# Patient Record
Sex: Male | Born: 2000 | Race: Black or African American | Hispanic: No | Marital: Single | Smoking: Former smoker
Health system: Southern US, Community
[De-identification: ages and names within clinical notes are randomized; demographics above are authoritative.]

## PROBLEM LIST (undated history)

## (undated) DIAGNOSIS — J45909 Unspecified asthma, uncomplicated: Secondary | ICD-10-CM

## (undated) DIAGNOSIS — F909 Attention-deficit hyperactivity disorder, unspecified type: Secondary | ICD-10-CM

---
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
## 2020-07-18 IMAGING — CR DG CHEST 2V
2 series · 2 of 2 positions shown · non-contrast
Comparison: None.

CLINICAL DATA: Cough and congestion for 4 days.

EXAM:
CHEST - 2 VIEW

[w chest pa]
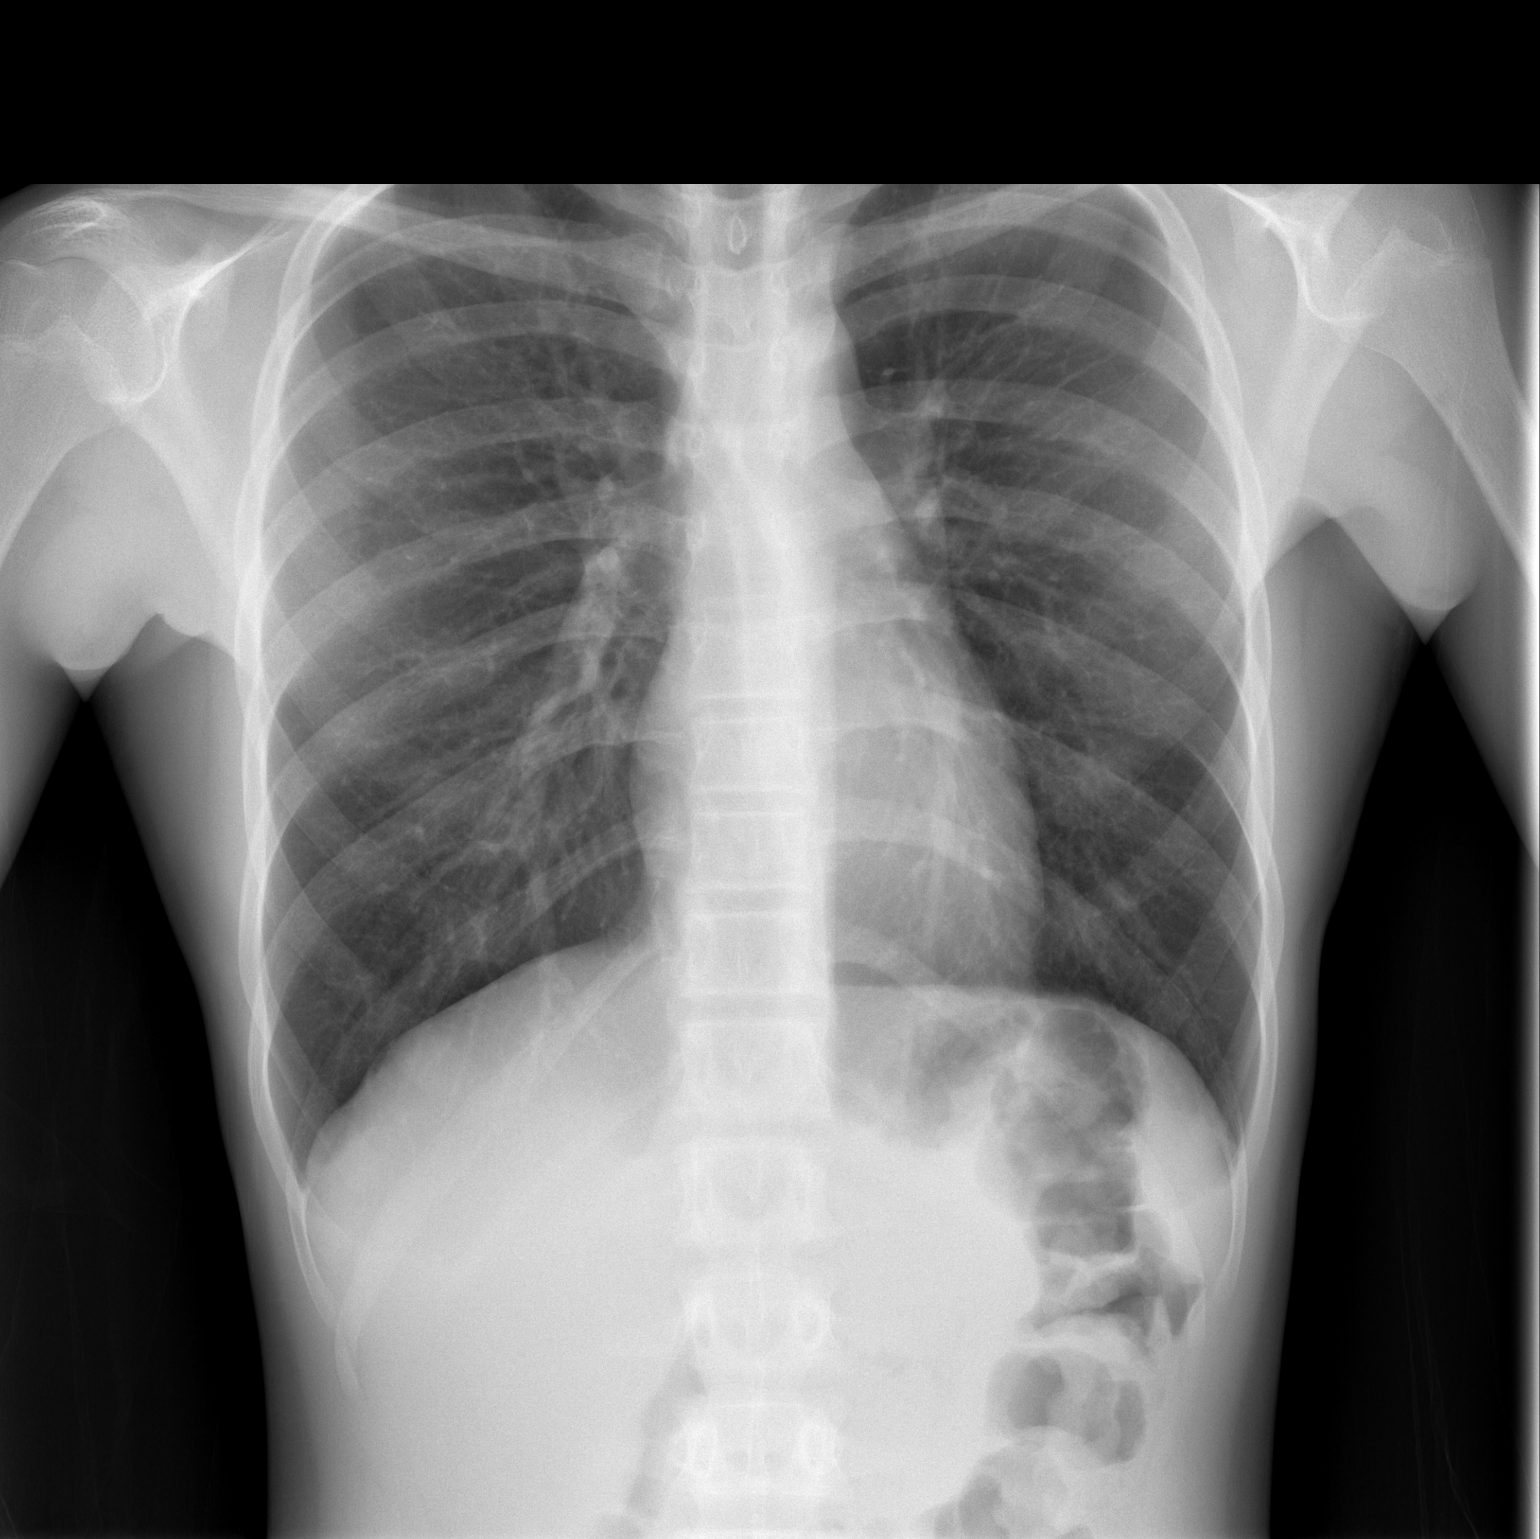

[w chest lat]
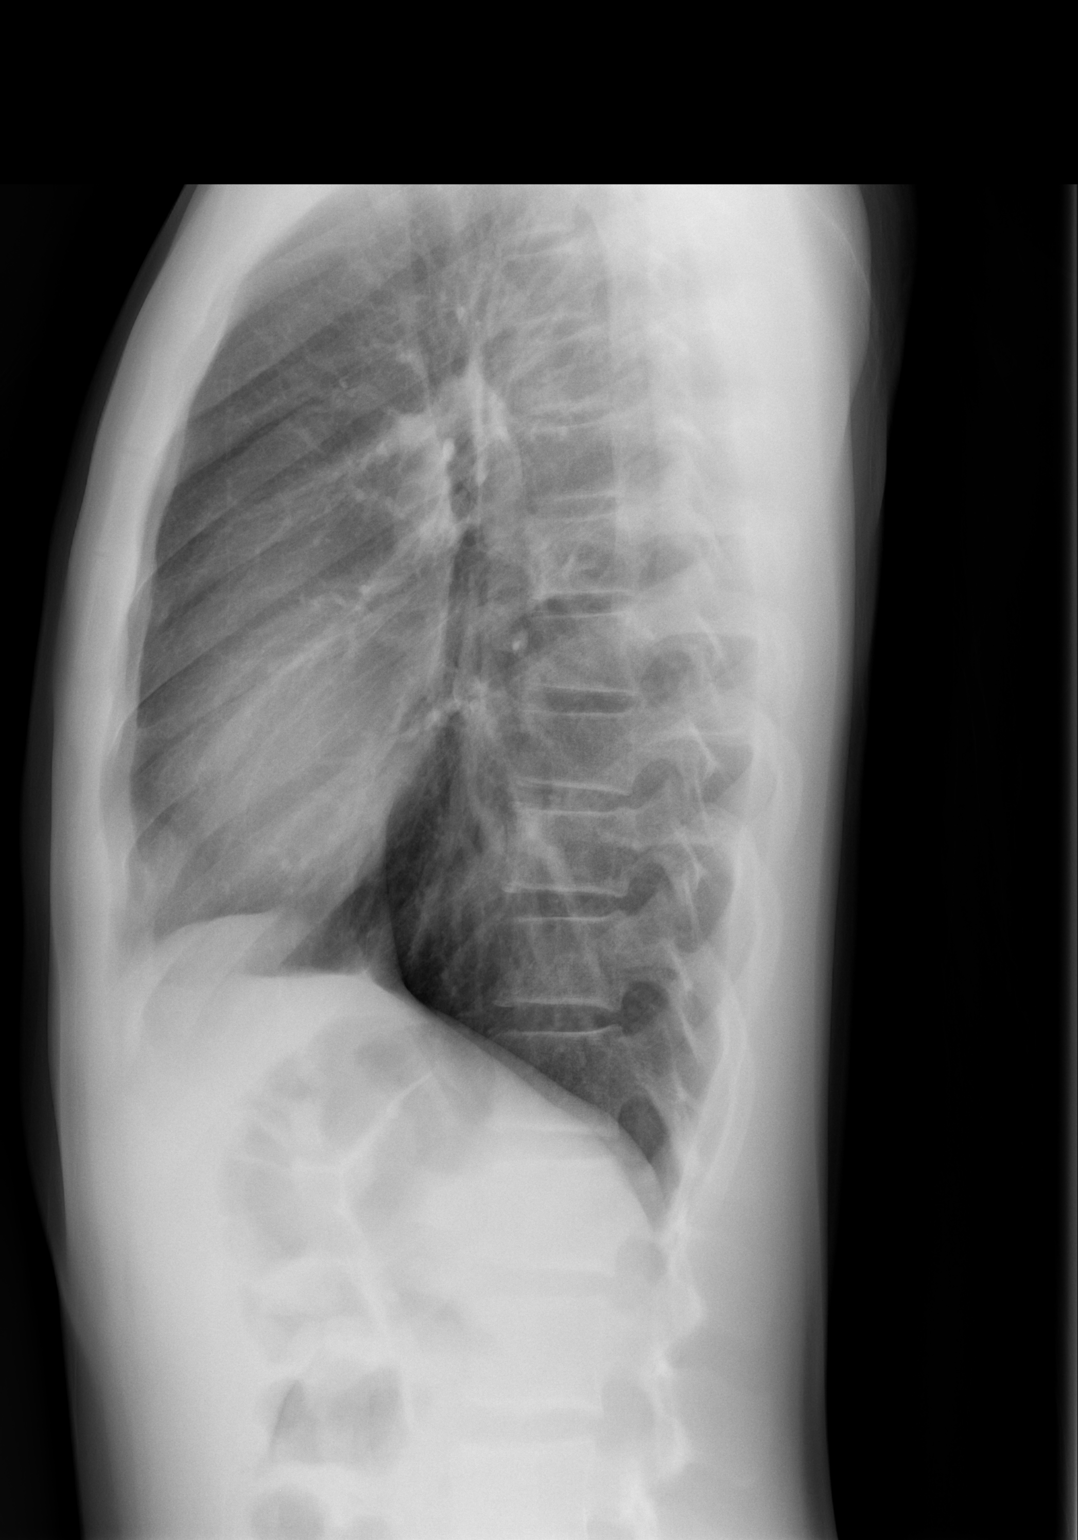

[2 of 2 positions shown; findings below may reference images not displayed]

FINDINGS: Cardiomediastinal silhouette is normal. No pleural effusions or
focal consolidations. Trachea projects midline and there is no
pneumothorax. Soft tissue planes and included osseous structures are
non-suspicious.
IMPRESSION: Normal chest radiograph.

## 2020-09-07 ENCOUNTER — Emergency Department (HOSPITAL_BASED_OUTPATIENT_CLINIC_OR_DEPARTMENT_OTHER): Payer: 59

## 2020-09-07 ENCOUNTER — Emergency Department (HOSPITAL_BASED_OUTPATIENT_CLINIC_OR_DEPARTMENT_OTHER)
Admission: EM | Admit: 2020-09-07 | Discharge: 2020-09-07 | Disposition: A | Discharge status: Home / Self Care | Payer: 59 | Attending: Emergency Medicine | Admitting: Emergency Medicine

## 2020-09-07 ENCOUNTER — Encounter (HOSPITAL_BASED_OUTPATIENT_CLINIC_OR_DEPARTMENT_OTHER): Payer: Self-pay

## 2020-09-07 ENCOUNTER — Other Ambulatory Visit: Payer: Self-pay

## 2020-09-07 DIAGNOSIS — Y9367 Activity, basketball: Secondary | ICD-10-CM | POA: Diagnosis not present

## 2020-09-07 DIAGNOSIS — Z5321 Procedure and treatment not carried out due to patient leaving prior to being seen by health care provider: Secondary | ICD-10-CM | POA: Insufficient documentation

## 2020-09-07 DIAGNOSIS — S6992XA Unspecified injury of left wrist, hand and finger(s), initial encounter: Secondary | ICD-10-CM | POA: Insufficient documentation

## 2020-09-07 DIAGNOSIS — W19XXXA Unspecified fall, initial encounter: Secondary | ICD-10-CM | POA: Diagnosis not present

## 2020-09-07 NOTE — ED Triage Notes (Signed)
Pt states he fell playing basketball-injured left wrist-NAD-steady

## 2021-02-23 ENCOUNTER — Emergency Department (HOSPITAL_BASED_OUTPATIENT_CLINIC_OR_DEPARTMENT_OTHER)
Admission: EM | Admit: 2021-02-23 | Discharge: 2021-02-23 | Disposition: A | Discharge status: Home / Self Care | Payer: 59 | Attending: Emergency Medicine | Admitting: Emergency Medicine

## 2021-02-23 ENCOUNTER — Encounter (HOSPITAL_BASED_OUTPATIENT_CLINIC_OR_DEPARTMENT_OTHER): Payer: Self-pay

## 2021-02-23 ENCOUNTER — Other Ambulatory Visit: Payer: Self-pay

## 2021-02-23 DIAGNOSIS — F17290 Nicotine dependence, other tobacco product, uncomplicated: Secondary | ICD-10-CM | POA: Insufficient documentation

## 2021-02-23 DIAGNOSIS — Z20822 Contact with and (suspected) exposure to covid-19: Secondary | ICD-10-CM | POA: Insufficient documentation

## 2021-02-23 DIAGNOSIS — J45909 Unspecified asthma, uncomplicated: Secondary | ICD-10-CM | POA: Insufficient documentation

## 2021-02-23 DIAGNOSIS — R519 Headache, unspecified: Secondary | ICD-10-CM | POA: Insufficient documentation

## 2021-02-23 LAB — RESP PANEL BY RT-PCR (FLU A&B, COVID) ARPGX2
Influenza A by PCR: NEGATIVE
Influenza B by PCR: NEGATIVE
SARS Coronavirus 2 by RT PCR: NEGATIVE

## 2021-02-23 NOTE — Discharge Instructions (Signed)
We have sent a COVID-19 test, the results will come back later today.  You will receive a phone call from [place] in regards to the test results.  If your test is positive, then you will have to quarantine for 5 days before he can return to work. If your test is negative, then you will be cleared to work immediately.

## 2021-02-23 NOTE — ED Triage Notes (Signed)
Pt states that he was sick with a stomach virus yesterday leaving work with [employer]. Pt reports he is feeling better now and needs a note to go back to work. Currently symptom free.

## 2021-02-23 NOTE — ED Provider Notes (Signed)
[facility] EMERGENCY DEPARTMENT Provider Note   CSN: [id number] Arrival date & time: 02/23/21  4098     History Chief Complaint  Patient presents with   work note    Michael Davila is a 20 y.o. male.  HPI    20 y/o M comes in w/ cc of work note. He had headache yday and the employer requested work note before he can return/ Pt feel better today. No headaches. Denies any uri like symptoms.   Past Medical History:  Diagnosis Date   ADHD    Asthma     There are no problems to display for this patient.   History reviewed. No pertinent surgical history.     No family history on file.  Social History   Tobacco Use   Smoking status: Every Day    Types: E-cigarettes   Smokeless tobacco: Never  Vaping Use   Vaping Use: Every day  Substance Use Topics   Alcohol use: Never   Drug use: Yes    Types: Marijuana    Home Medications Prior to Admission medications   Medication Sig Start Date End Date Taking? Authorizing Provider  acetaminophen (TYLENOL) 325 MG tablet Take 2 tablets (650 mg total) by mouth every 6 (six) hours as needed. Do not take more than 4000mg  of tylenol per day 07/26/18   Couture, Cortni S, PA-C  albuterol (PROVENTIL HFA;VENTOLIN HFA) 108 (90 Base) MCG/ACT inhaler Inhale into the lungs every 6 (six) hours as needed for wheezing or shortness of breath.    [provider]  ibuprofen (ADVIL,MOTRIN) 400 MG tablet Take 1 tablet (400 mg total) by mouth every 6 (six) hours as needed. 07/26/18   Couture, Cortni S, PA-C    Allergies    Penicillins  Review of Systems   Review of Systems  Constitutional:  Negative for activity change.  Neurological:  Positive for headaches.   Physical Exam Updated Vital Signs BP 123/84   Pulse 60   Temp 98.4 F (36.9 C) (Oral)   Resp 16   Ht 6\' 2"  (1.88 m)   Wt 77.1 kg   SpO2 100%   BMI 21.83 kg/m   Physical Exam Vitals and nursing note reviewed.  Constitutional:      Appearance: He is  well-developed.  HENT:     Head: Atraumatic.  Cardiovascular:     Rate and Rhythm: Normal rate.  Pulmonary:     Effort: Pulmonary effort is normal.  Musculoskeletal:     Cervical back: Neck supple.  Skin:    General: Skin is warm.  Neurological:     Mental Status: He is alert and oriented to person, place, and time.    ED Results / Procedures / Treatments   Labs (all labs ordered are listed, but only abnormal results are displayed) Labs Reviewed  RESP PANEL BY RT-PCR (FLU A&B, COVID) [id number]    EKG None  Radiology No results found.  Procedures Procedures   Medications Ordered in ED Medications - No data to display  ED Course  I have reviewed the triage vital signs and the nursing notes.  Pertinent labs & imaging results that were available during my care of the patient were reviewed by me and considered in my medical decision making (see chart for details).    MDM Rules/Calculators/A&P                            20 year old comes in a chief  complaint of headaches that have now resolved.  The work required him to get clearance, due to COVID-19 concerns.  He does indicate that with the headaches he also had some weakness.  Outpatient COVID-19 test sent.  Patient is vaccinated.  At 1205, I called in the results to him, and informed him that the test is negative.  He is cleared to work Advertising account executive.  Final Clinical Impression(s) / ED Diagnoses Final diagnoses:  Generalized headaches    Rx / DC Orders ED Discharge Orders     None        Derwood Kaplan, MD 02/23/21 1205

## 2021-10-21 ENCOUNTER — Emergency Department (HOSPITAL_BASED_OUTPATIENT_CLINIC_OR_DEPARTMENT_OTHER): Payer: Medicaid Other

## 2021-10-21 ENCOUNTER — Other Ambulatory Visit: Payer: Self-pay

## 2021-10-21 ENCOUNTER — Emergency Department (HOSPITAL_BASED_OUTPATIENT_CLINIC_OR_DEPARTMENT_OTHER)
Admission: EM | Admit: 2021-10-21 | Discharge: 2021-10-21 | Disposition: A | Discharge status: Home / Self Care | Payer: Medicaid Other | Attending: Emergency Medicine | Admitting: Emergency Medicine

## 2021-10-21 ENCOUNTER — Encounter (HOSPITAL_BASED_OUTPATIENT_CLINIC_OR_DEPARTMENT_OTHER): Payer: Self-pay

## 2021-10-21 DIAGNOSIS — S6991XA Unspecified injury of right wrist, hand and finger(s), initial encounter: Secondary | ICD-10-CM | POA: Insufficient documentation

## 2021-10-21 DIAGNOSIS — Y9367 Activity, basketball: Secondary | ICD-10-CM | POA: Insufficient documentation

## 2021-10-21 DIAGNOSIS — W2105XA Struck by basketball, initial encounter: Secondary | ICD-10-CM | POA: Insufficient documentation

## 2021-10-21 DIAGNOSIS — M79644 Pain in right finger(s): Secondary | ICD-10-CM | POA: Insufficient documentation

## 2021-10-21 NOTE — ED Triage Notes (Signed)
Pt report falling off bed 2-3 days ago and hurting right thumb. Pain subsides, but still swollen. Played basketball today and ball hit thumb causing it to bend backwards.  ?

## 2021-10-21 NOTE — ED Provider Notes (Signed)
?[facility] EMERGENCY DEPARTMENT ?Provider Note ? ? ?CSN: [id number] ?Arrival date & time: 10/21/21  1809 ? ?  ? ?History ? ?Chief Complaint  ?Patient presents with  ? Finger Injury  ? ? ?Michael Davila is a 21 y.o. male who presents emergency department complaining of a right thumb injury.  Patient states that he fell off his bed about 2 days ago and hurt his right thumb.  He states that he reinjured it while playing basketball today, when the ball hit his thumb causing it to bend backwards.  He has tried some ibuprofen which did help.  Reports that the pain is slightly improved, but still feels thumb swollen. ? ?HPI ? ?  ? ?Home Medications ?Prior to Admission medications   ?Medication Sig Start Date End Date Taking? Authorizing Provider  ?acetaminophen (TYLENOL) 325 MG tablet Take 2 tablets (650 mg total) by mouth every 6 (six) hours as needed. Do not take more than 4000mg  of tylenol per day 07/26/18   Couture, Cortni S, PA-C  ?albuterol (PROVENTIL HFA;VENTOLIN HFA) 108 (90 Base) MCG/ACT inhaler Inhale into the lungs every 6 (six) hours as needed for wheezing or shortness of breath.    [provider]  ?ibuprofen (ADVIL,MOTRIN) 400 MG tablet Take 1 tablet (400 mg total) by mouth every 6 (six) hours as needed. 07/26/18   Couture, Cortni S, PA-C  ?   ? ?Allergies    ?Penicillins   ? ?Review of Systems   ?Review of Systems  ?Musculoskeletal:   ?     Right thumb injury  ?All other systems reviewed and are negative. ? ?Physical Exam ?Updated Vital Signs ?BP 124/74 (BP Location: Left Arm)   Pulse 67   Temp 98.6 ?F (37 ?C) (Oral)   Resp 17   Ht 6\' 2"  (1.88 m)   Wt 79.4 kg   SpO2 99%   BMI 22.47 kg/m?  ?Physical Exam ?Vitals and nursing note reviewed.  ?Constitutional:   ?   Appearance: Normal appearance.  ?HENT:  ?   Head: Normocephalic and atraumatic.  ?Eyes:  ?   Conjunctiva/sclera: Conjunctivae normal.  ?Pulmonary:  ?   Effort: Pulmonary effort is normal. No respiratory distress.   ?Musculoskeletal:  ?   Left hand: Normal.  ?   Comments: Tenderness to palpation over right thumb MCP. No deformities. No skin changes. Normal capillary refill.  ?Skin: ?   General: Skin is warm and dry.  ?Neurological:  ?   Mental Status: He is alert.  ?Psychiatric:     ?   Mood and Affect: Mood normal.     ?   Behavior: Behavior normal.  ? ? ?ED Results / Procedures / Treatments   ?Labs ?(all labs ordered are listed, but only abnormal results are displayed) ?Labs Reviewed - No data to display ? ?EKG ?None ? ?Radiology ?DG Finger Thumb Right ? ?Result Date: 10/21/2021 ?CLINICAL DATA:  Fall from bed several days ago with right thumb pain, history of re-injury today while playing basketball, initial encounter EXAM: RIGHT THUMB 2+V COMPARISON:  None. FINDINGS: There is no evidence of fracture or dislocation. There is no evidence of arthropathy or other focal bone abnormality. Soft tissues are unremarkable. IMPRESSION: No acute abnormality noted. Electronically Signed   By: M.D.   On: 10/21/2021 19:18   ? ?Procedures ?Procedures  ? ? ?Medications Ordered in ED ?Medications - No data to display ? ?ED Course/ Medical Decision Making/ A&P ?  ?                        ?  Medical Decision Making ?Amount and/or Complexity of Data Reviewed ?Radiology: ordered. ? ? ?This patient is a 21 year old male who presents to the ED for concern of right thumb injury.  ? ?Differential diagnoses prior to evaluation: ?Acute fracture/dislocation, ligamentous injury.  This is not a an exhaustive differential. ? ?Past Medical History / Co-morbidities / Social History: ?Asthma ADHD ? ?Physical Exam: ?Physical exam performed. The pertinent findings include: Tenderness to palpation over right thumb MCP. No deformities. No skin changes. Normal capillary refill. ? ?Imaging: ?X-ray performed of the right thumb.  I personally interpreted the images, that showed no acute fractures or dislocations. ?  ?Disposition: ?After consideration of  the diagnostic results and the patients response to treatment, I feel that patient's not requiring mission or inpatient treatment for his symptoms.  Splinted his thumb for comfort, and recommended over-the-counter anti-inflammatories. Discussed reasons to return to the emergency department, and the patient is agreeable to the plan.  ? ?Final Clinical Impression(s) / ED Diagnoses ?Final diagnoses:  ?Injury of right thumb, initial encounter  ? ? ?Rx / DC Orders ?ED Discharge Orders   ? ? None  ? ?  ? ?Portions of this report may have been transcribed using voice recognition software. Every effort was made to ensure accuracy; however, inadvertent computerized transcription errors may be present. ? ?  ?Zamiyah Resendes T, PA-C ?10/21/21 2046 ? ?  ?Franne Forts, DO ?10/23/21 2110 ? ?

## 2021-10-21 NOTE — Discharge Instructions (Addendum)
You were seen in the emergency department today for a thumb injury. ? ?As we discussed I do not see any fractures/breaks on your x-ray.  It is likely that you sprained/jammed your thumb badly.  We placed this in a splint.  You can wear this both with activity and sleeping if you feel it is needed. ? ?You can take ibuprofen or Tylenol as needed for pain.  You can also use ice as needed for swelling. ?
